# Patient Record
Sex: Male | Born: 2000 | Race: White | Hispanic: No | Marital: Single | Smoking: Never smoker
Health system: Southern US, Community
[De-identification: ages and names within clinical notes are randomized; demographics above are authoritative.]

## PROBLEM LIST (undated history)

## (undated) DIAGNOSIS — F909 Attention-deficit hyperactivity disorder, unspecified type: Secondary | ICD-10-CM

---
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
## 2019-01-17 DIAGNOSIS — J45990 Exercise induced bronchospasm: Secondary | ICD-10-CM | POA: Diagnosis not present

## 2019-01-17 DIAGNOSIS — F909 Attention-deficit hyperactivity disorder, unspecified type: Secondary | ICD-10-CM | POA: Diagnosis not present

## 2019-03-30 DIAGNOSIS — Z0000 Encounter for general adult medical examination without abnormal findings: Secondary | ICD-10-CM | POA: Diagnosis not present

## 2019-04-19 DIAGNOSIS — F99 Mental disorder, not otherwise specified: Secondary | ICD-10-CM | POA: Diagnosis not present

## 2019-04-19 DIAGNOSIS — F4322 Adjustment disorder with anxiety: Secondary | ICD-10-CM | POA: Diagnosis not present

## 2019-04-20 DIAGNOSIS — F4322 Adjustment disorder with anxiety: Secondary | ICD-10-CM | POA: Diagnosis not present

## 2019-04-20 DIAGNOSIS — F99 Mental disorder, not otherwise specified: Secondary | ICD-10-CM | POA: Diagnosis not present

## 2019-05-16 DIAGNOSIS — S76011A Strain of muscle, fascia and tendon of right hip, initial encounter: Secondary | ICD-10-CM | POA: Diagnosis not present

## 2019-05-19 DIAGNOSIS — F99 Mental disorder, not otherwise specified: Secondary | ICD-10-CM | POA: Diagnosis not present

## 2019-05-19 DIAGNOSIS — F4322 Adjustment disorder with anxiety: Secondary | ICD-10-CM | POA: Diagnosis not present

## 2019-05-20 DIAGNOSIS — F4322 Adjustment disorder with anxiety: Secondary | ICD-10-CM | POA: Diagnosis not present

## 2019-05-20 DIAGNOSIS — F99 Mental disorder, not otherwise specified: Secondary | ICD-10-CM | POA: Diagnosis not present

## 2019-05-23 DIAGNOSIS — F411 Generalized anxiety disorder: Secondary | ICD-10-CM | POA: Diagnosis not present

## 2019-06-01 DIAGNOSIS — F4322 Adjustment disorder with anxiety: Secondary | ICD-10-CM | POA: Diagnosis not present

## 2019-06-01 DIAGNOSIS — F99 Mental disorder, not otherwise specified: Secondary | ICD-10-CM | POA: Diagnosis not present

## 2019-06-15 DIAGNOSIS — F418 Other specified anxiety disorders: Secondary | ICD-10-CM | POA: Diagnosis not present

## 2019-06-15 DIAGNOSIS — F331 Major depressive disorder, recurrent, moderate: Secondary | ICD-10-CM | POA: Diagnosis not present

## 2019-06-15 DIAGNOSIS — F411 Generalized anxiety disorder: Secondary | ICD-10-CM | POA: Diagnosis not present

## 2019-06-15 DIAGNOSIS — F99 Mental disorder, not otherwise specified: Secondary | ICD-10-CM | POA: Diagnosis not present

## 2019-06-22 DIAGNOSIS — F418 Other specified anxiety disorders: Secondary | ICD-10-CM | POA: Diagnosis not present

## 2019-06-22 DIAGNOSIS — F99 Mental disorder, not otherwise specified: Secondary | ICD-10-CM | POA: Diagnosis not present

## 2019-06-24 DIAGNOSIS — F418 Other specified anxiety disorders: Secondary | ICD-10-CM | POA: Diagnosis not present

## 2019-06-24 DIAGNOSIS — F99 Mental disorder, not otherwise specified: Secondary | ICD-10-CM | POA: Diagnosis not present

## 2019-07-01 DIAGNOSIS — F418 Other specified anxiety disorders: Secondary | ICD-10-CM | POA: Diagnosis not present

## 2019-07-01 DIAGNOSIS — F99 Mental disorder, not otherwise specified: Secondary | ICD-10-CM | POA: Diagnosis not present

## 2019-07-01 DIAGNOSIS — F331 Major depressive disorder, recurrent, moderate: Secondary | ICD-10-CM | POA: Diagnosis not present

## 2019-07-01 DIAGNOSIS — F411 Generalized anxiety disorder: Secondary | ICD-10-CM | POA: Diagnosis not present

## 2019-07-05 DIAGNOSIS — F331 Major depressive disorder, recurrent, moderate: Secondary | ICD-10-CM | POA: Diagnosis not present

## 2019-07-05 DIAGNOSIS — F411 Generalized anxiety disorder: Secondary | ICD-10-CM | POA: Diagnosis not present

## 2019-07-13 DIAGNOSIS — F99 Mental disorder, not otherwise specified: Secondary | ICD-10-CM | POA: Diagnosis not present

## 2019-07-13 DIAGNOSIS — F418 Other specified anxiety disorders: Secondary | ICD-10-CM | POA: Diagnosis not present

## 2019-07-26 DIAGNOSIS — F331 Major depressive disorder, recurrent, moderate: Secondary | ICD-10-CM | POA: Diagnosis not present

## 2019-07-26 DIAGNOSIS — F411 Generalized anxiety disorder: Secondary | ICD-10-CM | POA: Diagnosis not present

## 2019-07-27 DIAGNOSIS — F418 Other specified anxiety disorders: Secondary | ICD-10-CM | POA: Diagnosis not present

## 2019-07-27 DIAGNOSIS — F99 Mental disorder, not otherwise specified: Secondary | ICD-10-CM | POA: Diagnosis not present

## 2019-08-17 DIAGNOSIS — F99 Mental disorder, not otherwise specified: Secondary | ICD-10-CM | POA: Diagnosis not present

## 2019-08-17 DIAGNOSIS — F418 Other specified anxiety disorders: Secondary | ICD-10-CM | POA: Diagnosis not present

## 2019-08-24 DIAGNOSIS — F411 Generalized anxiety disorder: Secondary | ICD-10-CM | POA: Diagnosis not present

## 2019-08-24 DIAGNOSIS — F331 Major depressive disorder, recurrent, moderate: Secondary | ICD-10-CM | POA: Diagnosis not present

## 2019-09-07 DIAGNOSIS — F331 Major depressive disorder, recurrent, moderate: Secondary | ICD-10-CM | POA: Diagnosis not present

## 2019-09-07 DIAGNOSIS — F411 Generalized anxiety disorder: Secondary | ICD-10-CM | POA: Diagnosis not present

## 2019-09-14 DIAGNOSIS — F418 Other specified anxiety disorders: Secondary | ICD-10-CM | POA: Diagnosis not present

## 2019-09-14 DIAGNOSIS — F99 Mental disorder, not otherwise specified: Secondary | ICD-10-CM | POA: Diagnosis not present

## 2019-09-15 DIAGNOSIS — F39 Unspecified mood [affective] disorder: Secondary | ICD-10-CM | POA: Diagnosis not present

## 2019-09-20 DIAGNOSIS — F331 Major depressive disorder, recurrent, moderate: Secondary | ICD-10-CM | POA: Diagnosis not present

## 2019-09-20 DIAGNOSIS — F411 Generalized anxiety disorder: Secondary | ICD-10-CM | POA: Diagnosis not present

## 2019-09-21 DIAGNOSIS — F39 Unspecified mood [affective] disorder: Secondary | ICD-10-CM | POA: Diagnosis not present

## 2019-10-05 DIAGNOSIS — F39 Unspecified mood [affective] disorder: Secondary | ICD-10-CM | POA: Diagnosis not present

## 2019-10-14 DIAGNOSIS — F39 Unspecified mood [affective] disorder: Secondary | ICD-10-CM | POA: Diagnosis not present

## 2019-10-28 DIAGNOSIS — F39 Unspecified mood [affective] disorder: Secondary | ICD-10-CM | POA: Diagnosis not present

## 2019-11-10 DIAGNOSIS — F39 Unspecified mood [affective] disorder: Secondary | ICD-10-CM | POA: Diagnosis not present

## 2019-11-25 DIAGNOSIS — F39 Unspecified mood [affective] disorder: Secondary | ICD-10-CM | POA: Diagnosis not present

## 2019-12-05 DIAGNOSIS — F331 Major depressive disorder, recurrent, moderate: Secondary | ICD-10-CM | POA: Diagnosis not present

## 2019-12-05 DIAGNOSIS — F411 Generalized anxiety disorder: Secondary | ICD-10-CM | POA: Diagnosis not present

## 2019-12-07 DIAGNOSIS — F39 Unspecified mood [affective] disorder: Secondary | ICD-10-CM | POA: Diagnosis not present

## 2019-12-19 DIAGNOSIS — F411 Generalized anxiety disorder: Secondary | ICD-10-CM | POA: Diagnosis not present

## 2019-12-19 DIAGNOSIS — F331 Major depressive disorder, recurrent, moderate: Secondary | ICD-10-CM | POA: Diagnosis not present

## 2019-12-19 DIAGNOSIS — F39 Unspecified mood [affective] disorder: Secondary | ICD-10-CM | POA: Diagnosis not present

## 2019-12-26 DIAGNOSIS — Z113 Encounter for screening for infections with a predominantly sexual mode of transmission: Secondary | ICD-10-CM | POA: Diagnosis not present

## 2019-12-26 DIAGNOSIS — F39 Unspecified mood [affective] disorder: Secondary | ICD-10-CM | POA: Diagnosis not present

## 2020-01-02 DIAGNOSIS — F39 Unspecified mood [affective] disorder: Secondary | ICD-10-CM | POA: Diagnosis not present

## 2020-01-11 DIAGNOSIS — F411 Generalized anxiety disorder: Secondary | ICD-10-CM | POA: Diagnosis not present

## 2020-01-11 DIAGNOSIS — F331 Major depressive disorder, recurrent, moderate: Secondary | ICD-10-CM | POA: Diagnosis not present

## 2020-01-16 DIAGNOSIS — F39 Unspecified mood [affective] disorder: Secondary | ICD-10-CM | POA: Diagnosis not present

## 2020-01-23 DIAGNOSIS — F39 Unspecified mood [affective] disorder: Secondary | ICD-10-CM | POA: Diagnosis not present

## 2020-01-30 DIAGNOSIS — F39 Unspecified mood [affective] disorder: Secondary | ICD-10-CM | POA: Diagnosis not present

## 2020-02-08 DIAGNOSIS — F411 Generalized anxiety disorder: Secondary | ICD-10-CM | POA: Diagnosis not present

## 2020-02-08 DIAGNOSIS — F331 Major depressive disorder, recurrent, moderate: Secondary | ICD-10-CM | POA: Diagnosis not present

## 2020-02-13 DIAGNOSIS — F39 Unspecified mood [affective] disorder: Secondary | ICD-10-CM | POA: Diagnosis not present

## 2020-02-23 DIAGNOSIS — F39 Unspecified mood [affective] disorder: Secondary | ICD-10-CM | POA: Diagnosis not present

## 2020-02-27 DIAGNOSIS — F39 Unspecified mood [affective] disorder: Secondary | ICD-10-CM | POA: Diagnosis not present

## 2020-03-07 DIAGNOSIS — F411 Generalized anxiety disorder: Secondary | ICD-10-CM | POA: Diagnosis not present

## 2020-03-07 DIAGNOSIS — F331 Major depressive disorder, recurrent, moderate: Secondary | ICD-10-CM | POA: Diagnosis not present

## 2020-03-09 DIAGNOSIS — F39 Unspecified mood [affective] disorder: Secondary | ICD-10-CM | POA: Diagnosis not present

## 2020-03-12 DIAGNOSIS — F39 Unspecified mood [affective] disorder: Secondary | ICD-10-CM | POA: Diagnosis not present

## 2020-03-19 DIAGNOSIS — F39 Unspecified mood [affective] disorder: Secondary | ICD-10-CM | POA: Diagnosis not present

## 2020-03-23 DIAGNOSIS — F411 Generalized anxiety disorder: Secondary | ICD-10-CM | POA: Diagnosis not present

## 2020-03-23 DIAGNOSIS — F331 Major depressive disorder, recurrent, moderate: Secondary | ICD-10-CM | POA: Diagnosis not present

## 2020-03-26 DIAGNOSIS — F39 Unspecified mood [affective] disorder: Secondary | ICD-10-CM | POA: Diagnosis not present

## 2020-04-02 DIAGNOSIS — F39 Unspecified mood [affective] disorder: Secondary | ICD-10-CM | POA: Diagnosis not present

## 2020-04-09 DIAGNOSIS — F39 Unspecified mood [affective] disorder: Secondary | ICD-10-CM | POA: Diagnosis not present

## 2020-04-16 DIAGNOSIS — F39 Unspecified mood [affective] disorder: Secondary | ICD-10-CM | POA: Diagnosis not present

## 2020-04-16 DIAGNOSIS — Z0000 Encounter for general adult medical examination without abnormal findings: Secondary | ICD-10-CM | POA: Diagnosis not present

## 2020-04-16 DIAGNOSIS — Z23 Encounter for immunization: Secondary | ICD-10-CM | POA: Diagnosis not present

## 2020-04-16 DIAGNOSIS — Z13220 Encounter for screening for lipoid disorders: Secondary | ICD-10-CM | POA: Diagnosis not present

## 2020-04-20 DIAGNOSIS — F331 Major depressive disorder, recurrent, moderate: Secondary | ICD-10-CM | POA: Diagnosis not present

## 2020-04-20 DIAGNOSIS — F411 Generalized anxiety disorder: Secondary | ICD-10-CM | POA: Diagnosis not present

## 2020-04-23 DIAGNOSIS — F39 Unspecified mood [affective] disorder: Secondary | ICD-10-CM | POA: Diagnosis not present

## 2020-04-30 DIAGNOSIS — F39 Unspecified mood [affective] disorder: Secondary | ICD-10-CM | POA: Diagnosis not present

## 2020-05-07 DIAGNOSIS — F39 Unspecified mood [affective] disorder: Secondary | ICD-10-CM | POA: Diagnosis not present

## 2020-05-10 DIAGNOSIS — Z1152 Encounter for screening for COVID-19: Secondary | ICD-10-CM | POA: Diagnosis not present

## 2020-05-15 DIAGNOSIS — F39 Unspecified mood [affective] disorder: Secondary | ICD-10-CM | POA: Diagnosis not present

## 2020-05-22 DIAGNOSIS — F39 Unspecified mood [affective] disorder: Secondary | ICD-10-CM | POA: Diagnosis not present

## 2020-05-24 DIAGNOSIS — F411 Generalized anxiety disorder: Secondary | ICD-10-CM | POA: Diagnosis not present

## 2020-05-24 DIAGNOSIS — F331 Major depressive disorder, recurrent, moderate: Secondary | ICD-10-CM | POA: Diagnosis not present

## 2020-05-29 DIAGNOSIS — F39 Unspecified mood [affective] disorder: Secondary | ICD-10-CM | POA: Diagnosis not present

## 2020-06-12 DIAGNOSIS — F39 Unspecified mood [affective] disorder: Secondary | ICD-10-CM | POA: Diagnosis not present

## 2020-06-19 DIAGNOSIS — F39 Unspecified mood [affective] disorder: Secondary | ICD-10-CM | POA: Diagnosis not present

## 2020-06-20 DIAGNOSIS — F331 Major depressive disorder, recurrent, moderate: Secondary | ICD-10-CM | POA: Diagnosis not present

## 2020-06-20 DIAGNOSIS — F411 Generalized anxiety disorder: Secondary | ICD-10-CM | POA: Diagnosis not present

## 2020-06-26 DIAGNOSIS — F39 Unspecified mood [affective] disorder: Secondary | ICD-10-CM | POA: Diagnosis not present

## 2020-07-03 DIAGNOSIS — F39 Unspecified mood [affective] disorder: Secondary | ICD-10-CM | POA: Diagnosis not present

## 2020-07-17 DIAGNOSIS — F411 Generalized anxiety disorder: Secondary | ICD-10-CM | POA: Diagnosis not present

## 2020-07-17 DIAGNOSIS — F331 Major depressive disorder, recurrent, moderate: Secondary | ICD-10-CM | POA: Diagnosis not present

## 2020-07-17 DIAGNOSIS — F39 Unspecified mood [affective] disorder: Secondary | ICD-10-CM | POA: Diagnosis not present

## 2020-07-24 DIAGNOSIS — F39 Unspecified mood [affective] disorder: Secondary | ICD-10-CM | POA: Diagnosis not present

## 2020-07-31 DIAGNOSIS — F39 Unspecified mood [affective] disorder: Secondary | ICD-10-CM | POA: Diagnosis not present

## 2020-08-07 DIAGNOSIS — F39 Unspecified mood [affective] disorder: Secondary | ICD-10-CM | POA: Diagnosis not present

## 2020-08-14 DIAGNOSIS — F39 Unspecified mood [affective] disorder: Secondary | ICD-10-CM | POA: Diagnosis not present

## 2020-08-21 DIAGNOSIS — F39 Unspecified mood [affective] disorder: Secondary | ICD-10-CM | POA: Diagnosis not present
